# Patient Record
Sex: Male | Born: 1940 | Hispanic: Yes | Marital: Married | Smoking: Never smoker
Health system: Southern US, Community
[De-identification: ages and names within clinical notes are randomized; demographics above are authoritative.]

## PROBLEM LIST (undated history)

## (undated) HISTORY — PX: BACK SURGERY: SHX140

---

## 2005-01-02 ENCOUNTER — Other Ambulatory Visit: Payer: Self-pay

## 2005-01-02 ENCOUNTER — Emergency Department: Payer: Self-pay | Admitting: Emergency Medicine

## 2006-06-26 ENCOUNTER — Ambulatory Visit: Payer: Self-pay

## 2011-05-06 ENCOUNTER — Emergency Department: Payer: Self-pay | Admitting: Internal Medicine

## 2011-07-26 ENCOUNTER — Ambulatory Visit: Payer: Self-pay | Admitting: Unknown Physician Specialty

## 2011-08-11 ENCOUNTER — Ambulatory Visit: Payer: Self-pay | Admitting: Unknown Physician Specialty

## 2020-02-01 ENCOUNTER — Ambulatory Visit: Payer: Medicare Other | Attending: Internal Medicine

## 2020-02-01 DIAGNOSIS — Z23 Encounter for immunization: Secondary | ICD-10-CM

## 2020-02-01 NOTE — Progress Notes (Signed)
   Covid-19 Vaccination Clinic  Name:  Ilyas Lipsitz    MRN: [id number] DOB: [birth year]  02/01/2020  Mr. Vital Patsi Sears was observed post Covid-19 immunization for 15 minutes without incident. He was provided with Vaccine Information Sheet and instruction to access the V-Safe system.   Mr. Dallas Torok was instructed to call 911 with any severe reactions post vaccine: Marland Kitchen Difficulty breathing  . Swelling of face and throat  . A fast heartbeat  . A bad rash all over body  . Dizziness and weakness   Immunizations Administered    Name Date Dose VIS Date Route   Pfizer COVID-19 Vaccine 02/01/2020 12:11 PM 0.3 mL 10/17/2019 Intramuscular   Manufacturer: [employer], [employer]   Lot: [id number]   NDC: [address]-6

## 2020-02-25 ENCOUNTER — Ambulatory Visit: Payer: Medicare Other | Attending: Internal Medicine

## 2020-02-25 DIAGNOSIS — Z23 Encounter for immunization: Secondary | ICD-10-CM

## 2020-02-25 NOTE — Progress Notes (Signed)
   Covid-19 Vaccination Clinic  Name:  Stephen Bauer    MRN: [id number] DOB: [birth year]  02/25/2020  Mr. Stephen Bauer was observed post Covid-19 immunization for 15 minutes without incident. He was provided with Vaccine Information Sheet and instruction to access the V-Safe system.   Mr. Stephen Bauer was instructed to call 911 with any severe reactions post vaccine: Marland Kitchen Difficulty breathing  . Swelling of face and throat  . A fast heartbeat  . A bad rash all over body  . Dizziness and weakness   Immunizations Administered    Name Date Dose VIS Date Route   Pfizer COVID-19 Vaccine 02/25/2020  3:54 PM 0.3 mL 12/31/2018 Intramuscular   Manufacturer: [employer], [employer]   Lot: [id number]   NDC: [address]-6

## 2022-01-02 ENCOUNTER — Other Ambulatory Visit: Payer: Self-pay | Admitting: Neurology

## 2022-01-02 DIAGNOSIS — R413 Other amnesia: Secondary | ICD-10-CM

## 2022-01-02 DIAGNOSIS — R519 Headache, unspecified: Secondary | ICD-10-CM

## 2022-01-02 DIAGNOSIS — H538 Other visual disturbances: Secondary | ICD-10-CM

## 2022-01-02 DIAGNOSIS — G459 Transient cerebral ischemic attack, unspecified: Secondary | ICD-10-CM

## 2022-01-13 ENCOUNTER — Ambulatory Visit
Admission: RE | Admit: 2022-01-13 | Discharge: 2022-01-13 | Disposition: A | Discharge status: Home / Self Care | Payer: Medicare Other | Source: Ambulatory Visit | Attending: Neurology | Admitting: Neurology

## 2022-01-13 DIAGNOSIS — G459 Transient cerebral ischemic attack, unspecified: Secondary | ICD-10-CM | POA: Diagnosis present

## 2022-01-13 DIAGNOSIS — R413 Other amnesia: Secondary | ICD-10-CM | POA: Diagnosis present

## 2022-01-13 DIAGNOSIS — H538 Other visual disturbances: Secondary | ICD-10-CM | POA: Insufficient documentation

## 2022-01-13 DIAGNOSIS — R519 Headache, unspecified: Secondary | ICD-10-CM | POA: Diagnosis present

## 2022-01-19 ENCOUNTER — Other Ambulatory Visit (HOSPITAL_COMMUNITY): Payer: Self-pay | Admitting: Neurology

## 2022-01-19 ENCOUNTER — Other Ambulatory Visit: Payer: Self-pay | Admitting: Neurology

## 2022-02-03 ENCOUNTER — Other Ambulatory Visit: Payer: Self-pay | Admitting: Neurology

## 2022-02-03 ENCOUNTER — Ambulatory Visit
Admission: RE | Admit: 2022-02-03 | Discharge: 2022-02-03 | Disposition: A | Discharge status: Home / Self Care | Payer: Medicare Other | Source: Ambulatory Visit | Attending: Neurology | Admitting: Neurology

## 2022-02-03 DIAGNOSIS — R519 Headache, unspecified: Secondary | ICD-10-CM | POA: Insufficient documentation

## 2022-02-03 DIAGNOSIS — R9089 Other abnormal findings on diagnostic imaging of central nervous system: Secondary | ICD-10-CM | POA: Insufficient documentation

## 2022-02-03 DIAGNOSIS — H538 Other visual disturbances: Secondary | ICD-10-CM

## 2022-02-03 DIAGNOSIS — R413 Other amnesia: Secondary | ICD-10-CM | POA: Diagnosis present

## 2022-02-03 MED ORDER — GADOBUTROL 1 MMOL/ML IV SOLN
7.5000 mL | Freq: Once | INTRAVENOUS | Status: AC | PRN
  Administered 2022-02-03: 7.5 mL via INTRAVENOUS

## 2022-10-04 IMAGING — MR MR HEAD WO/W CM
17 series · 48 of 48 positions shown · IV contrast (gadavist)
Comparison: MRI of the brain January 13, 2022.

CLINICAL DATA: Abnormal brain MRI T9Y.N9 (QOL-HD-CM). Memory
difficulty 0X4.Z (QOL-HD-CM). Intermittent headache
(QOL-HD-CM). Blurred vision I80.F (QOL-HD-CM).

EXAM:
MRI HEAD WITHOUT AND WITH CONTRAST
TECHNIQUE: Multiplanar, multiecho pulse sequences of the brain and surrounding
structures were obtained without and with intravenous contrast.
CONTRAST:  7.5mL GADAVIST GADOBUTROL 1 MMOL/ML IV SOLN

[Series 5: ax dwi_tracew · axial · 3.0mm · 0.65mm/px · z∈[-71,+80]mm · 3 of 48 slices shown]
[im 1/48]
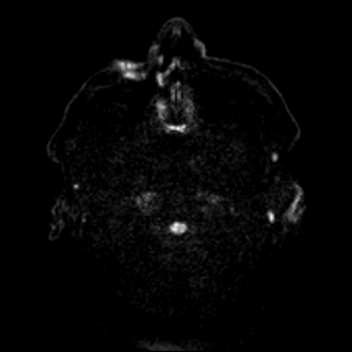
[im 24/48]
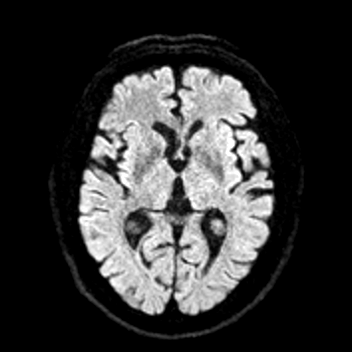
[im 48/48]
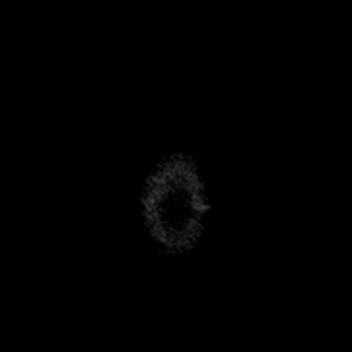

[Series 6: ax dwi_adc · axial · 3.0mm · 0.65mm/px · z∈[-71,+80]mm · 2 of 48 slices shown]
[im 1/48]
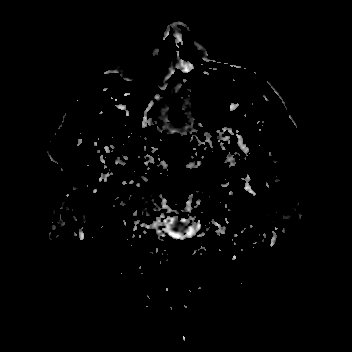
[im 48/48]
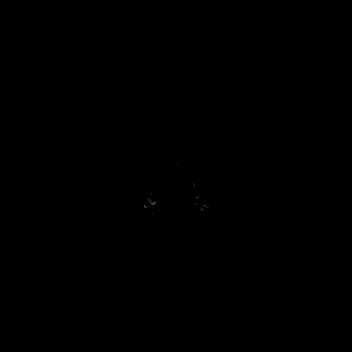

[Series 7: cor dwi_tracew · coronal · 5.0mm · 0.60mm/px · 2 of 38 slices shown]
[im 1/38]
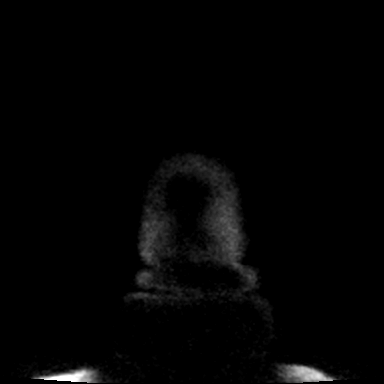
[im 38/38]
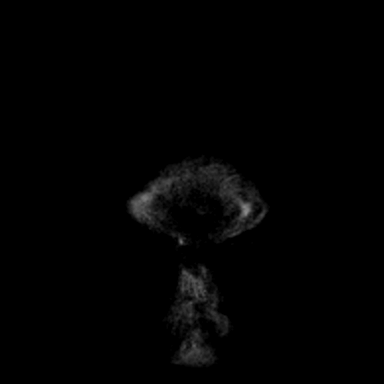

[Series 8: cor dwi_adc · coronal · 5.0mm · 0.60mm/px · 2 of 37 slices shown]
[im 1/37]
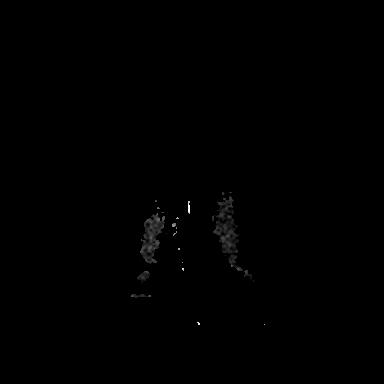
[im 37/37]
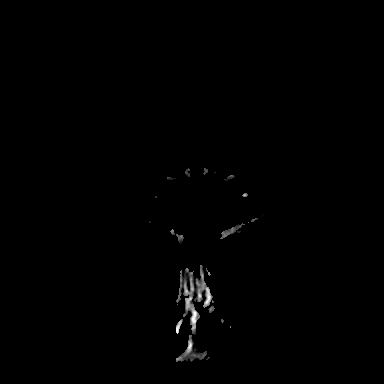

[Series 9: T1 · sagittal · 5.0mm · 0.62mm/px · 1 of 23 slices shown (1 of 4)]
[im 1/23]
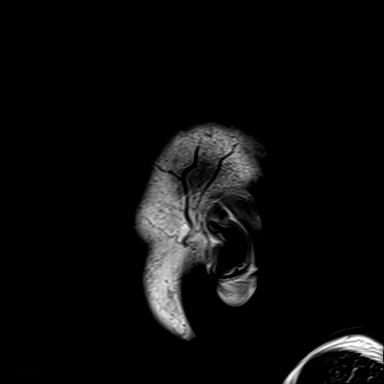

[Series 10: T2 · axial · 5.0mm · 0.53mm/px · 1 of 27 slices shown]
[im 1/27]
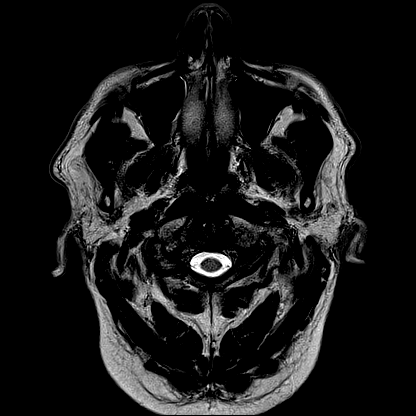

[Series 12: ax swi_pha · axial · 1.5mm · 0.90mm/px · z∈[-70,+80]mm · 5 of 104 slices shown]
[im 1/104]
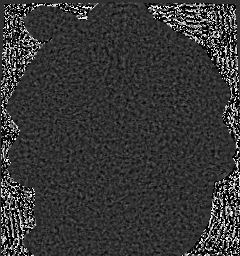
[im 26/104]
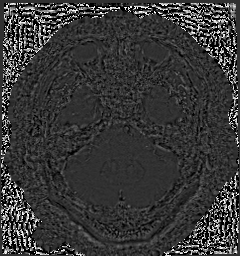
[im 52/104]
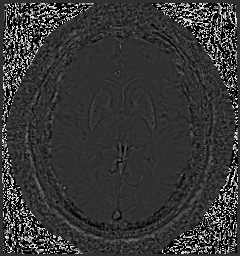
[im 78/104]
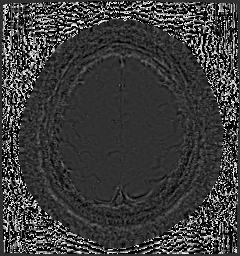
[im 104/104]
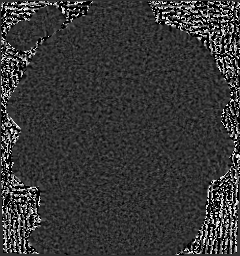

[Series 13: ax swi_swi · axial · 1.5mm · 0.90mm/px · z∈[-70,+80]mm · 5 of 104 slices shown]
[im 1/104]
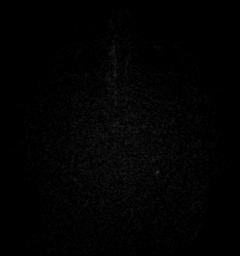
[im 26/104]
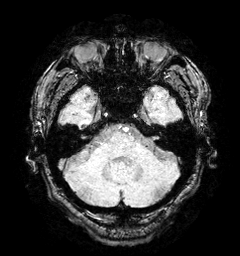
[im 52/104]
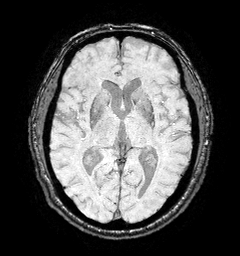
[im 78/104]
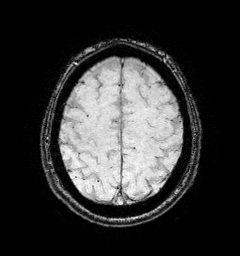
[im 104/104]
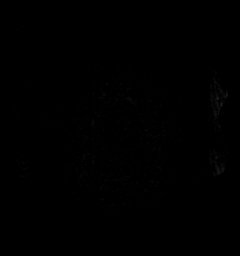

[Series 14: ax swi_swi_mip · axial · 12.0mm · 0.90mm/px · z∈[-65,+75]mm · 4 of 97 slices shown]
[im 1/97]
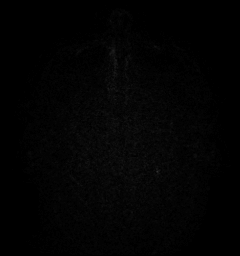
[im 33/97]
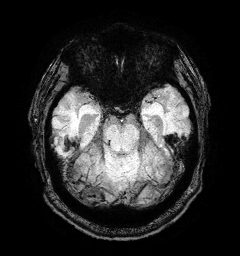
[im 65/97]
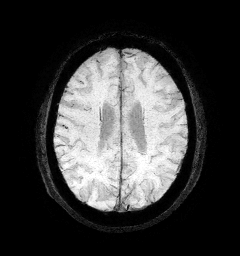
[im 97/97]
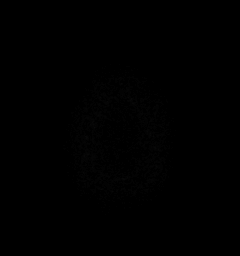

[Series 15: FLAIR · axial · 3.0mm · 0.69mm/px · z∈[-75,+83]mm · 2 of 55 slices shown]
[im 1/55]
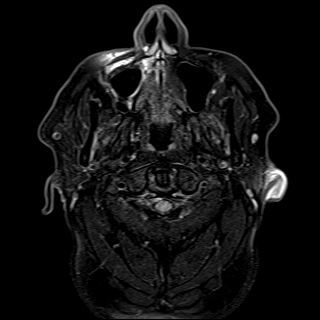
[im 55/55]
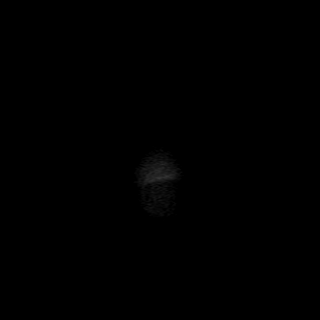

[Series 16: T1 · axial · 1.0mm · 0.98mm/px · z∈[-75,+96]mm · 8 of 175 slices shown (2 of 4)]
[im 1/175]
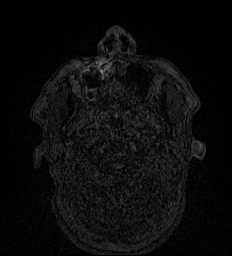
[im 25/175]
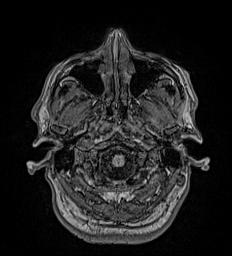
[im 50/175]
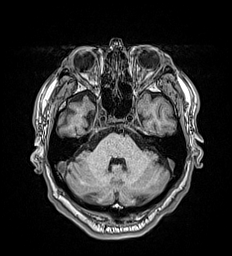
[im 75/175]
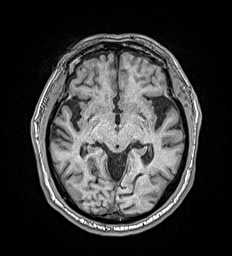
[im 100/175]
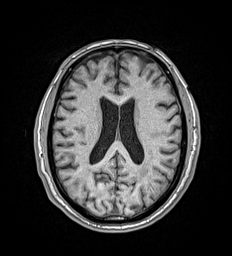
[im 125/175]
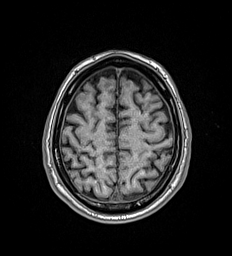
[im 150/175]
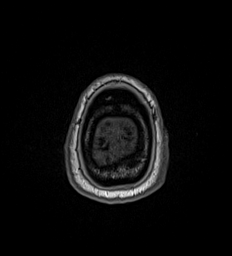
[im 175/175]
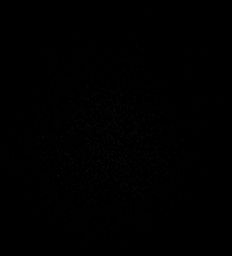

[Series 19: T1 · axial · 5.0mm · 0.86mm/px · 1 of 27 slices shown (3 of 4)]
[im 1/27]
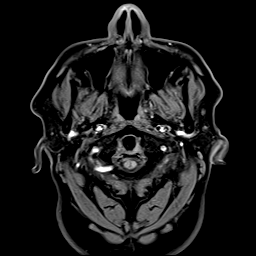

[Series 20: T2 post-contrast · coronal · 5.0mm · 0.57mm/px · 1 of 31 slices shown]
[im 1/31]
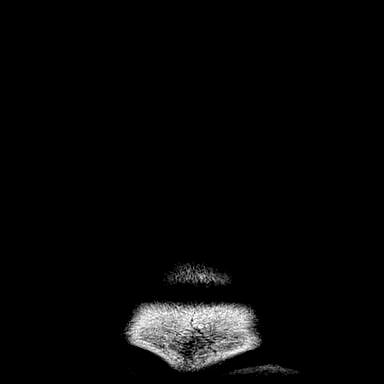

[Series 21: T1 post-contrast · axial · 1.0mm · 0.98mm/px · z∈[-75,+96]mm · 8 of 176 slices shown (1 of 3)]
[im 1/176]
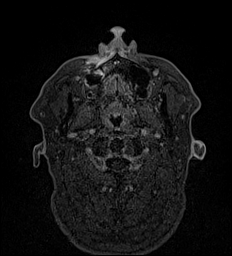
[im 26/176]
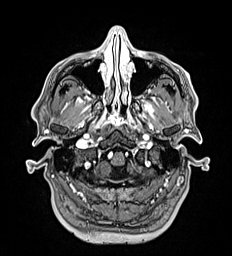
[im 51/176]
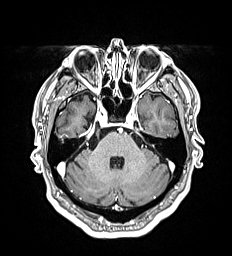
[im 76/176]
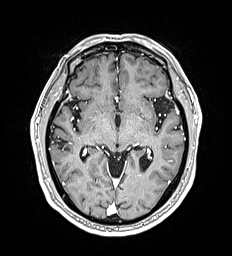
[im 101/176]
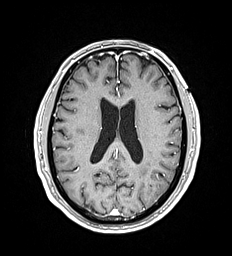
[im 126/176]
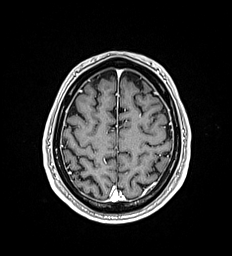
[im 151/176]
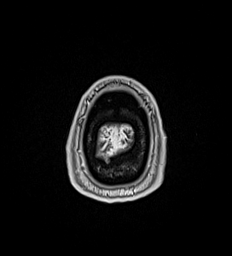
[im 176/176]
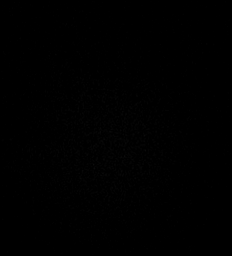

[Series 22: T1 post-contrast · coronal · 5.0mm · 0.57mm/px · 1 of 31 slices shown (2 of 3)]
[im 1/31]
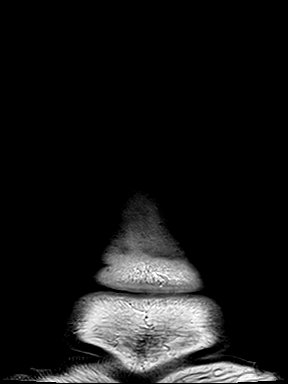

[Series 23: T1 post-contrast · sagittal · 5.0mm · 0.62mm/px · 1 of 23 slices shown (3 of 3)]
[im 1/23]
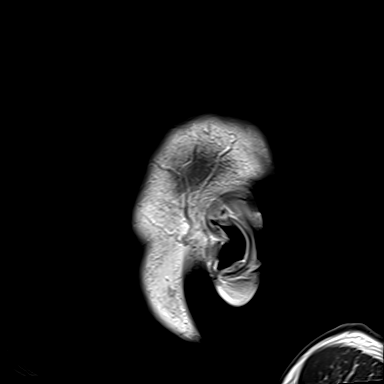

[Series 25: T1 · axial · 5.0mm · 0.86mm/px · 1 of 27 slices shown (4 of 4)]
[im 1/27]
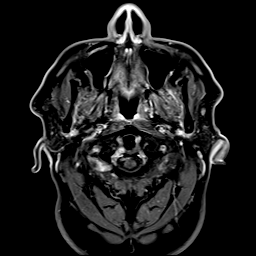

[48 of 48 positions shown; findings below may reference images not displayed]

FINDINGS: Brain: No acute infarction, hemorrhage, hydrocephalus or extra-axial
collection.

Area of increased T2 signal in the inferior left temporal occipital
region with associated cortical increased T1 signal is consistent
with remote infarct with associated cortical laminar necrosis.
Remote cortical infarcts are also seen in the left frontal and right
parietal lobes. Additional remote lacunar infarcts are seen in the
left thalamus and right centrum semiovale. Scattered foci of T2
hyperintensity within the white matter of the cerebral hemispheres,
nonspecific, most likely related to chronic microangiopathy,
unchanged. Mild parenchymal volume loss.

An 8 mm dural-based extra-axial lesion in the paramedian right
frontal region, most likely representing a small meningioma, appear
unchanged.

No focus of abnormal contrast enhancement identified.

Vascular: Normal flow voids.

Skull and upper cervical spine: Normal marrow signal.

Sinuses/Orbits: Negative.

Other: None.
IMPRESSION: 1. No acute intracranial abnormality.
2. Previously described areas of abnormal T2/FLAIR signal in the
inferior left temporal occipital region, left frontal and right
parietal cortex appear stable and are most consistent with remote
infarcts with associated cortical laminar necrosis. No abnormal
contrast enhancement to suggest leptomeningeal process.
3. Mild chronic microvascular ischemic changes and parenchymal
volume loss.
4. Stable 8 mm right frontal meningioma.

## 2023-10-09 ENCOUNTER — Other Ambulatory Visit: Payer: Self-pay

## 2023-10-09 ENCOUNTER — Emergency Department (HOSPITAL_BASED_OUTPATIENT_CLINIC_OR_DEPARTMENT_OTHER)
Admission: EM | Admit: 2023-10-09 | Discharge: 2023-10-09 | Disposition: A | Discharge status: Home / Self Care | Payer: Medicare Other | Attending: Emergency Medicine | Admitting: Emergency Medicine

## 2023-10-09 ENCOUNTER — Emergency Department (HOSPITAL_BASED_OUTPATIENT_CLINIC_OR_DEPARTMENT_OTHER): Payer: Medicare Other | Admitting: Radiology

## 2023-10-09 ENCOUNTER — Encounter (HOSPITAL_BASED_OUTPATIENT_CLINIC_OR_DEPARTMENT_OTHER): Payer: Self-pay | Admitting: *Deleted

## 2023-10-09 DIAGNOSIS — R001 Bradycardia, unspecified: Secondary | ICD-10-CM | POA: Insufficient documentation

## 2023-10-09 LAB — BASIC METABOLIC PANEL
Anion gap: 8 (ref 5–15)
BUN: 16 mg/dL (ref 8–23)
CO2: 28 mmol/L (ref 22–32)
Calcium: 9.6 mg/dL (ref 8.9–10.3)
Chloride: 101 mmol/L (ref 98–111)
Creatinine, Ser: 0.89 mg/dL (ref 0.61–1.24)
GFR, Estimated: >60 mL/min (ref >60)
Glucose, Bld: 109 mg/dL — ABNORMAL HIGH (ref 70–99)
Potassium: 4 mmol/L (ref 3.5–5.1)
Sodium: 137 mmol/L (ref 135–145)

## 2023-10-09 LAB — TROPONIN I (HIGH SENSITIVITY): Troponin I (High Sensitivity): 5 ng/L (ref <18)

## 2023-10-09 LAB — CBC
HCT: 40.7 % (ref 39.0–52.0)
Hemoglobin: 13.3 g/dL (ref 13.0–17.0)
MCH: 27.9 pg (ref 26.0–34.0)
MCHC: 32.7 g/dL (ref 30.0–36.0)
MCV: 85.3 fL (ref 80.0–100.0)
Platelets: 237 10*3/uL (ref 150–400)
RBC: 4.77 MIL/uL (ref 4.22–5.81)
RDW: 15.1 % (ref 11.5–15.5)
WBC: 7.2 10*3/uL (ref 4.0–10.5)
nRBC: 0 % (ref 0.0–0.2)

## 2023-10-09 NOTE — ED Provider Notes (Addendum)
[place] EMERGENCY DEPARTMENT AT [facility] Provider Note   CSN: [id number] Arrival date & time: 10/09/23  1018     History  Chief Complaint  Patient presents with   Bradycardia    Stephen Bauer is a 82 y.o. male.  Interview completed with aid of Spanish interpreter.  This is a 82 year old gentleman who presented to the ED today due to bradycardia.  He was seeing a nurse for clearance regarding a possible cataract surgery when he was noted to be bradycardic, was was told to present to the ED for further evaluation.  He is asymptomatic, denies chest pain, lightheadedness, shortness of breath, lower extremity edema.  He states he feels well in his normal state of health.  He is unsure if he is had bradycardia in the past.  The history is provided by the patient and a relative.       Home Medications Prior to Admission medications   Not on File      Allergies    Patient has no known allergies.    Review of Systems   Review of Systems  Constitutional:  Negative for activity change, diaphoresis and fatigue.  Respiratory:  Negative for shortness of breath.   Cardiovascular:  Negative for chest pain and leg swelling.    Physical Exam Updated Vital Signs BP 138/65   Pulse (!) 44   Temp 97.8 F (36.6 C)   Resp 15   SpO2 100%  Physical Exam Constitutional:      Appearance: Normal appearance.  Cardiovascular:     Rate and Rhythm: Normal rate and regular rhythm.  Pulmonary:     Effort: Pulmonary effort is normal.     Breath sounds: Normal breath sounds.  Abdominal:     General: Abdomen is flat. There is no distension.     Palpations: Abdomen is soft.  Neurological:     Mental Status: He is alert.     ED Results / Procedures / Treatments   Labs (all labs ordered are listed, but only abnormal results are displayed) Labs Reviewed  BASIC METABOLIC PANEL - Abnormal; Notable for the following components:      Result Value   Glucose, Bld 109 (*)     All other components within normal limits  CBC  TROPONIN I (HIGH SENSITIVITY)  TROPONIN I (HIGH SENSITIVITY)    EKG EKG Interpretation Date/Time:  Tuesday October 09 2023 10:27:10 EST Ventricular Rate:  51 PR Interval:  184 QRS Duration:  84 QT Interval:  456 QTC Calculation: 420 R Axis:   87  Text Interpretation: Sinus bradycardia with occasional Premature ventricular complexes Otherwise normal ECG When compared with ECG of 02-Jan-2005 13:38, Premature ventricular complexes are now Present No significant change since last tracing Confirmed by Jacalyn Lefevre [phone]) on 10/09/2023 11:55:38 AM  Radiology DG Chest 2 View  Result Date: 10/09/2023 CLINICAL DATA:  asymptomatic bradycardia. EXAM: CHEST - 2 VIEW COMPARISON:  05/06/2011. FINDINGS: Bilateral lung fields are clear. Bilateral costophrenic angles are clear. Normal cardio-mediastinal silhouette. No acute osseous abnormalities. The soft tissues are within normal limits. IMPRESSION: *No active cardiopulmonary disease. Electronically Signed   By: Jules Schick M.D.   On: 10/09/2023 11:57    Procedures Procedures    Medications Ordered in ED Medications - No data to display  ED Course/ Medical Decision Making/ A&P  Medical Decision Making This is an 82 year old gentleman presenting with asymptomatic sinus bradycardia.  Upon further review, the patient has been bradycardic for some time now.  I see multiple heart rates in the 50s documented, and the patient follows consistently with PCP.  He is asymptomatic, I imagine this is just the patient's normal heart rate.  He does have some PVCs, but is otherwise well.  CBC, BMP, troponin unremarkable.  Hemodynamically stable, feels well.  Patient is safe to discharge.  Amount and/or Complexity of Data Reviewed Labs: ordered. Radiology: ordered.           Final Clinical Impression(s) / ED Diagnoses Final diagnoses:  Bradycardia    Rx /  DC Orders ED Discharge Orders     None         Lovie Macadamia, MD 10/09/23 1539    Lovie Macadamia, MD 10/09/23 1539    Jacalyn Lefevre, MD 10/12/23 [phone]

## 2023-10-09 NOTE — Discharge Instructions (Signed)
You were seen today in the ED for low heart rate, looking back on your medical record this seems to be normal for you.  Do not have any symptoms which would warrant further workup.  If you have any chest pain, shortness of breath, lightheadedness, or dizziness please seek medical attention.  Otherwise please follow-up with your regular doctor and eye doctor for further evaluation regarding your cataract surgery.

## 2023-10-09 NOTE — ED Notes (Signed)
Called patient in lobby, pt in x ray

## 2023-10-09 NOTE — ED Triage Notes (Signed)
Pt was at eye doctor and they noted that pt was having bradycardia.  Pt is feeling well, no cp or no sob.  No dizziness, no sob.
